# Patient Record
Sex: Male | Born: 2000 | Race: White | Hispanic: No | Marital: Single | Smoking: Never smoker
Health system: Southern US, Community
[De-identification: ages and names within clinical notes are randomized; demographics above are authoritative.]

## PROBLEM LIST (undated history)

## (undated) DIAGNOSIS — J45909 Unspecified asthma, uncomplicated: Secondary | ICD-10-CM

## (undated) DIAGNOSIS — Z789 Other specified health status: Secondary | ICD-10-CM

---
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
## 2022-07-12 ENCOUNTER — Ambulatory Visit: Payer: Managed Care, Other (non HMO) | Admitting: Adult Health

## 2022-07-12 ENCOUNTER — Encounter: Payer: Self-pay | Admitting: Adult Health

## 2022-07-12 VITALS — BP 120/80 | HR 79 | Temp 98.4°F | Ht 70.5 in | Wt 187.0 lb

## 2022-07-12 DIAGNOSIS — Z0000 Encounter for general adult medical examination without abnormal findings: Secondary | ICD-10-CM | POA: Diagnosis not present

## 2022-07-12 NOTE — Progress Notes (Addendum)
Patient presents to clinic today to establish care. He is a peasant 21 year old male who  has a past medical history of Asthma and Medical history non-contributory.  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   Acute Concerns: Establish Care   Chronic Issues: None   Health Maintenance: Dental -- Routine Care Vision -- Does not do routine care  Immunizations -- UTD - refuses flu shot.  Colonoscopy -- Never had  Diet: Tries to eat healthy.  Exercise: Does exercise on a routine basis.    Past Medical History:  Diagnosis Date   Asthma    as an infant   Medical history non-contributory     Past Surgical History:  Procedure Laterality Date   HARDWARE REMOVAL Right 04/24/2014   Procedure: HARDWARE REMOVAL;  Surgeon: Cammy Copa, MD;  Location: [facility] OR;  Service: Orthopedics;  Laterality: Right;   ORIF ELBOW FRACTURE Right 10/17/2013   Procedure: OPEN REDUCTION INTERNAL FIXATION (ORIF) ELBOW/OLECRANON FRACTURE;  Surgeon: Cammy Copa, MD;  Location: [facility] OR;  Service: Orthopedics;  Laterality: Right;    No current outpatient medications on file prior to visit.   No current facility-administered medications on file prior to visit.    No Known Allergies  Family History  Problem Relation Age of Onset   Asthma Mother    Breast cancer Mother    Skin cancer Father    Heart disease Paternal Grandfather     Social History   Socioeconomic History   Marital status: Single    Spouse name: Not on file   Number of children: Not on file   Years of education: Not on file   Highest education level: Not on file  Occupational History   Not on file  Tobacco Use   Smoking status: Never    Passive exposure: Yes   Smokeless tobacco: Never  Vaping Use   Vaping Use: Former   Quit date: 01/19/2021  Substance and Sexual Activity   Alcohol use: Yes    Alcohol/week: 8.0 standard drinks of alcohol    Types: 8 Cans of beer per week   Drug use: Not Currently   Sexual  activity: Not on file  Other Topics Concern   Not on file  Social History Narrative   Not on file   Social Determinants of Health   Financial Resource Strain: Not on file  Food Insecurity: Not on file  Transportation Needs: Not on file  Physical Activity: Not on file  Stress: Not on file  Social Connections: Not on file  Intimate Partner Violence: Not on file    Review of Systems  Constitutional: Negative.   HENT: Negative.    Eyes: Negative.   Respiratory: Negative.    Cardiovascular: Negative.   Gastrointestinal: Negative.   Genitourinary: Negative.   Musculoskeletal: Negative.   Skin: Negative.   Neurological: Negative.   Endo/Heme/Allergies: Negative.   Psychiatric/Behavioral: Negative.    All other systems reviewed and are negative.   BP 120/80   Pulse 79   Temp 98.4 F (36.9 C) (Oral)   Ht 5' 10.5" (1.791 m)   Wt 187 lb (84.8 kg)   SpO2 98%   BMI 26.45 kg/m   Physical Exam Vitals and nursing note reviewed.  Constitutional:      General: He is not in acute distress.    Appearance: Normal appearance. He is well-developed and normal weight.  HENT:     Head: Normocephalic and atraumatic.     Right  Ear: Tympanic membrane, ear canal and external ear normal. There is no impacted cerumen.     Left Ear: Tympanic membrane, ear canal and external ear normal. There is no impacted cerumen.     Nose: Nose normal. No congestion or rhinorrhea.     Mouth/Throat:     Mouth: Mucous membranes are moist.     Pharynx: Oropharynx is clear. No oropharyngeal exudate or posterior oropharyngeal erythema.  Eyes:     General:        Right eye: No discharge.        Left eye: No discharge.     Extraocular Movements: Extraocular movements intact.     Conjunctiva/sclera: Conjunctivae normal.     Pupils: Pupils are equal, round, and reactive to light.  Neck:     Vascular: No carotid bruit.     Trachea: No tracheal deviation.  Cardiovascular:     Rate and Rhythm: Normal rate and  regular rhythm.     Pulses: Normal pulses.     Heart sounds: Normal heart sounds. No murmur heard.    No friction rub. No gallop.  Pulmonary:     Effort: Pulmonary effort is normal. No respiratory distress.     Breath sounds: Normal breath sounds. No stridor. No wheezing, rhonchi or rales.  Chest:     Chest wall: No tenderness.  Abdominal:     General: Bowel sounds are normal. There is no distension.     Palpations: Abdomen is soft. There is no mass.     Tenderness: There is no abdominal tenderness. There is no right CVA tenderness, left CVA tenderness, guarding or rebound.     Hernia: No hernia is present.  Musculoskeletal:        General: No swelling, tenderness, deformity or signs of injury. Normal range of motion.     Right lower leg: No edema.     Left lower leg: No edema.  Lymphadenopathy:     Cervical: No cervical adenopathy.  Skin:    General: Skin is warm and dry.     Capillary Refill: Capillary refill takes less than 2 seconds.     Coloration: Skin is not jaundiced or pale.     Findings: No bruising, erythema, lesion or rash.  Neurological:     General: No focal deficit present.     Mental Status: He is alert and oriented to person, place, and time.     Cranial Nerves: No cranial nerve deficit.     Sensory: No sensory deficit.     Motor: No weakness.     Coordination: Coordination normal.     Gait: Gait normal.     Deep Tendon Reflexes: Reflexes normal.  Psychiatric:        Mood and Affect: Mood normal.        Behavior: Behavior normal.        Thought Content: Thought content normal.        Judgment: Judgment normal.     Assessment/Plan: 1. Routine general medical examination at a health care facility - Benign exam  - Follow up on one year or sooner if needed - Continue with exercise and work on diet   Shirline Frees, NP

## 2022-07-12 NOTE — Patient Instructions (Signed)
It was great meeting you today   Please let me know if you need anything   Follow up in a year or sooner if needed

## 2022-07-18 NOTE — Addendum Note (Signed)
Addended by: Nancy Fetter on: 07/18/2022 06:48 AM   Modules accepted: Level of Service

## 2023-03-19 ENCOUNTER — Telehealth: Payer: Managed Care, Other (non HMO) | Admitting: Physician Assistant

## 2023-03-19 DIAGNOSIS — L0100 Impetigo, unspecified: Secondary | ICD-10-CM

## 2023-03-19 MED ORDER — MUPIROCIN 2 % EX OINT
1.0000 | TOPICAL_OINTMENT | Freq: Two times a day (BID) | CUTANEOUS | 0 refills | Status: AC
Start: 2023-03-19 — End: ?

## 2023-03-19 MED ORDER — SULFAMETHOXAZOLE-TRIMETHOPRIM 800-160 MG PO TABS
1.0000 | ORAL_TABLET | Freq: Two times a day (BID) | ORAL | 0 refills | Status: AC
Start: 2023-03-19 — End: ?

## 2023-03-19 NOTE — Progress Notes (Signed)
Virtual Visit Consent   Seth Nielsen, you are scheduled for a virtual visit with a [facility] provider today. Just as with appointments in the office, your consent must be obtained to participate. Your consent will be active for this visit and any virtual visit you may have with one of our providers in the next 365 days. If you have a MyChart account, a copy of this consent can be sent to you electronically.  As this is a virtual visit, video technology does not allow for your provider to perform a traditional examination. This may limit your provider's ability to fully assess your condition. If your provider identifies any concerns that need to be evaluated in person or the need to arrange testing (such as labs, EKG, etc.), we will make arrangements to do so. Although advances in technology are sophisticated, we cannot ensure that it will always work on either your end or our end. If the connection with a video visit is poor, the visit may have to be switched to a telephone visit. With either a video or telephone visit, we are not always able to ensure that we have a secure connection.  By engaging in this virtual visit, you consent to the provision of healthcare and authorize for your insurance to be billed (if applicable) for the services provided during this visit. Depending on your insurance coverage, you may receive a charge related to this service.  I need to obtain your verbal consent now. Are you willing to proceed with your visit today? Seth Nielsen has provided verbal consent on 03/19/2023 for a virtual visit (video or telephone). Margaretann Loveless, PA-C  Date: 03/19/2023 7:51 AM  Virtual Visit via Video Note   I, Margaretann Loveless, connected with  Seth Nielsen  ([id number], 2000/10/21) on 03/19/23 at  7:45 AM EDT by a video-enabled telemedicine application and verified that I am speaking with the correct person using two identifiers.  Location: Patient: Virtual Visit  Location Patient: Home Provider: Virtual Visit Location Provider: Home Office   I discussed the limitations of evaluation and management by telemedicine and the availability of in person appointments. The patient expressed understanding and agreed to proceed.    History of Present Illness: Seth Nielsen is a 22 y.o. who identifies as a male who was assigned male at birth, and is being seen today for skin infection on face. Over the last few days had a spot come up on the left temporal area. It grew in size then spread down and a second lesion arose below it. It does not itch. There is some clear to yellow discharge. Mild pain. Denies fevers, chills, nausea, vomiting.  Of note: was recently treated for an axillary abscess or infected sebaceous cyst on 02/18/23. Treated with Doxycycline. Patient did not complete therapy.   Problems: There are no problems to display for this patient.   Allergies: No Known Allergies Medications:  Current Outpatient Medications:    mupirocin ointment (BACTROBAN) 2 %, Apply 1 Application topically 2 (two) times daily., Disp: 22 g, Rfl: 0   sulfamethoxazole-trimethoprim (BACTRIM DS) 800-160 MG tablet, Take 1 tablet by mouth 2 (two) times daily., Disp: 10 tablet, Rfl: 0  Observations/Objective: Patient is well-developed, well-nourished in no acute distress.  Resting comfortably at home.  Head is normocephalic, atraumatic.  No labored breathing.  Speech is clear and coherent with logical content.  Patient is alert and oriented at baseline.  2 well demarcated annular lesions with central vesicle on  both and erythematous base  Assessment and Plan: 1. Impetigo - mupirocin ointment (BACTROBAN) 2 %; Apply 1 Application topically 2 (two) times daily.  Dispense: 22 g; Refill: 0 - sulfamethoxazole-trimethoprim (BACTRIM DS) 800-160 MG tablet; Take 1 tablet by mouth 2 (two) times daily.  Dispense: 10 tablet; Refill: 0  - Suspect Impetigo - Prescribed Mupirocin and  Bactrim - Wash areas 2-3 times daily with soap and warm water - Wash pillow case and bed clothes after on antibiotic for 2 days  - Seek in person evaluation if worsens or fails to resolve  Follow Up Instructions: I discussed the assessment and treatment plan with the patient. The patient was provided an opportunity to ask questions and all were answered. The patient agreed with the plan and demonstrated an understanding of the instructions.  A copy of instructions were sent to the patient via MyChart unless otherwise noted below.    The patient was advised to call back or seek an in-person evaluation if the symptoms worsen or if the condition fails to improve as anticipated.  Time:  I spent 12 minutes with the patient via telehealth technology discussing the above problems/concerns.    Margaretann Loveless, PA-C

## 2023-03-19 NOTE — Patient Instructions (Signed)
Janeann Forehand, thank you for joining Margaretann Loveless, PA-C for today's virtual visit.  While this provider is not your primary care provider (PCP), if your PCP is located in our provider database this encounter information will be shared with them immediately following your visit.   A [facility] MyChart account gives you access to today's visit and all your visits, tests, and labs performed at [facility] " click here if you don't have a [facility] MyChart account or go to [email or web address]  Consent: (Patient) Seth Nielsen provided verbal consent for this virtual visit at the beginning of the encounter.  Current Medications:  Current Outpatient Medications:    mupirocin ointment (BACTROBAN) 2 %, Apply 1 Application topically 2 (two) times daily., Disp: 22 g, Rfl: 0   sulfamethoxazole-trimethoprim (BACTRIM DS) 800-160 MG tablet, Take 1 tablet by mouth 2 (two) times daily., Disp: 10 tablet, Rfl: 0   Medications ordered in this encounter:  Meds ordered this encounter  Medications   mupirocin ointment (BACTROBAN) 2 %    Sig: Apply 1 Application topically 2 (two) times daily.    Dispense:  22 g    Refill:  0    Order Specific Question:   Supervising Provider    Answer:   Merrilee Jansky [[id number]]   sulfamethoxazole-trimethoprim (BACTRIM DS) 800-160 MG tablet    Sig: Take 1 tablet by mouth 2 (two) times daily.    Dispense:  10 tablet    Refill:  0    Order Specific Question:   Supervising Provider    Answer:   Merrilee Jansky [id number]     *If you need refills on other medications prior to your next appointment, please contact your pharmacy*  Follow-Up: Call back or seek an in-person evaluation if the symptoms worsen or if the condition fails to improve as anticipated.  [facility] [phone]  Other Instructions Impetigo, Adult Impetigo is an infection of the skin. It commonly occurs in young children, but it can also occur in  adults. The infection causes itchy blisters and sores that produce brownish-yellow fluid. As the fluid dries, it forms a thick, honey-colored crust. These skin changes usually occur on the face, but they can also affect other areas of the body. Impetigo usually goes away in 7-10 days with treatment. What are the causes? This condition is caused by two types of bacteria. It may be caused by staphylococci or streptococci bacteria. These bacteria cause impetigo when they get under the surface of the skin. This often happens after some damage to the skin, such as: Cuts, scrapes, or scratches. Rashes. Insect bites, especially when you scratch the area of a bite. Chickenpox or other illnesses that cause open skin sores. Nail biting or chewing. Impetigo can spread easily from one person to another (is contagious). It may be spread through close skin contact or by sharing towels, clothing, or other items that an infected person has touched. Scratching the affected area can cause impetigo to spread to other parts of the body. The bacteria can get under your fingernails and spread when you touch another area of your skin. What increases the risk? The following factors may make you more likely to develop this condition: Playing sports that include skin-to-skin contact with others. Having broken skin, such as from a cut or scrape. Living in an area that has high humidity levels. Having poor hygiene. Having high levels of staphylococci in your nose. Having a condition that weakens the skin  integrity, such as: Having a weak body defense system (immune system). Having a skin condition with open sores, such as chickenpox. Having diabetes. What are the signs or symptoms? The main symptom of this condition is small blisters, often on the face around the mouth and nose. In time, the blisters break open and turn into tiny sores (lesions) with a yellow crust. In some cases, the blisters cause itching or burning.  Scratching, irritation, or lack of treatment may cause these small lesions to get larger. Other possible symptoms include: Larger blisters. Pus. Swollen lymph glands. How is this diagnosed? This condition is usually diagnosed during a physical exam. A skin sample or a sample of fluid from a blister may be taken for lab tests that involve growing bacteria (culture test). Lab tests can help confirm the diagnosis or help determine the best treatment. How is this treated? Treatment for this condition depends on the severity of the condition: Mild impetigo can be treated with prescription antibiotic cream. Oral antibiotic medicine may be used in more severe cases. Medicines that reduce itchiness (antihistamines)may also be used. Follow these instructions at home: Medicines Take over-the-counter and prescription medicines only as told by your health care provider. Apply or take your antibiotic as told by your health care provider. Do not stop using the antibiotic even if your condition improves. Before applying antibiotic cream or ointment, you should: Gently wash the infected areas with antibacterial soap and warm water. Soak crusted areas in warm, soapy water using antibacterial soap. Gently rub the areas to remove crusts. Do not scrub. Preventing the spread of infection  To help prevent impetigo from spreading to other body areas: Keep your fingernails short and clean. Do not scratch the blisters or sores. Cover infected areas, if necessary, to keep from scratching. Wash your hands often with soap and warm water. To help prevent impetigo from spreading to other people: Do not share towels. Wash your clothing and bedsheets in water that is 140F (60C) or warmer. Stay home until you have used an antibiotic cream for 48 hours (2 days) or an oral antibiotic medicine for 24 hours (1 day). You should only return to work and activities with other people if your skin shows significant  improvement. You may return to contact sports after you have used antibiotic medicine for 72 hours (3 days). General instructions Keep all follow-up visits. This is important. How is this prevented? Wash your hands often with soap and warm water. Do not share towels, washcloths, clothing, bedding, or razors. Keep your fingernails short. Keep any cuts, scrapes, bug bites, or rashes clean and covered. Use insect repellent to prevent bug bites. Contact a health care provider if: You develop more blisters or sores, even with treatment. Other family members get sores. Your skin sores are not improving after 72 hours (3 days) of treatment. You have a fever. Get help right away if: You see spreading redness or swelling of the skin around your sores. You develop a sore throat. The area around your rash becomes warm, red, or tender to the touch. You have dark, reddish-brown urine. You do not urinate often or you urinate small amounts. You are very tired (lethargic). You have swelling in your face, hands, or feet. Summary Impetigo is a skin infection that causes itchy blisters and sores that produce brownish-yellow fluid. As the fluid dries, it forms a crust. This condition is caused by staphylococci or streptococci bacteria. These bacteria cause impetigo when they get under the surface of the  skin, such as through cuts, rashes, bug bites, or open sores. Treatment for this condition may include antibiotic ointment or oral antibiotics. To help prevent impetigo from spreading to other body areas, make sure you keep your fingernails short, avoid scratching, cover any blisters, and wash your hands often. If you have impetigo, stay home until you have used an antibiotic cream for 48 hours (2 days) or an oral antibiotic medicine for 24 hours (1 day). You should only return to work and activities with other people if your skin shows significant improvement. This information is not intended to replace  advice given to you by your health care provider. Make sure you discuss any questions you have with your health care provider. Document Revised: 01/07/2020 Document Reviewed: 01/07/2020 [employer] Patient Education  2024 [employer]    If you have been instructed to have an in-person evaluation today at a local Urgent Care facility, please use the link below. It will take you to a list of all of our available [facility], including address, phone number and hours of operation. Please do not delay care.  [facility]  If you or a family member do not have a primary care provider, use the link below to schedule a visit and establish care. When you choose a [facility] primary care physician or advanced practice provider, you gain a long-term partner in health. Find a Primary Care Provider  Learn more about [facility] in-office and virtual care options: [facility] - Get Care Now
